# Patient Record
Sex: Male | Born: 1980 | Race: White | Hispanic: No | Marital: Married | Smoking: Never smoker
Health system: Southern US, Community
[De-identification: ages and names within clinical notes are randomized; demographics above are authoritative.]

---

## 2016-11-19 ENCOUNTER — Encounter (INDEPENDENT_AMBULATORY_CARE_PROVIDER_SITE_OTHER): Payer: BLUE CROSS/BLUE SHIELD | Admitting: Ophthalmology

## 2016-11-20 ENCOUNTER — Encounter (INDEPENDENT_AMBULATORY_CARE_PROVIDER_SITE_OTHER): Payer: BLUE CROSS/BLUE SHIELD | Admitting: Ophthalmology

## 2016-11-20 ENCOUNTER — Ambulatory Visit: Admit: 2016-11-20 | Payer: Self-pay | Admitting: Ophthalmology

## 2016-11-20 DIAGNOSIS — H338 Other retinal detachments: Secondary | ICD-10-CM

## 2016-11-20 DIAGNOSIS — H5213 Myopia, bilateral: Secondary | ICD-10-CM | POA: Diagnosis not present

## 2016-11-20 DIAGNOSIS — H2512 Age-related nuclear cataract, left eye: Secondary | ICD-10-CM

## 2016-11-20 DIAGNOSIS — H4312 Vitreous hemorrhage, left eye: Secondary | ICD-10-CM | POA: Diagnosis not present

## 2016-11-20 DIAGNOSIS — H43813 Vitreous degeneration, bilateral: Secondary | ICD-10-CM | POA: Diagnosis not present

## 2016-11-20 SURGERY — SCLERAL BUCKLE
Anesthesia: General

## 2018-10-18 ENCOUNTER — Ambulatory Visit: Payer: BC Managed Care – PPO

## 2018-10-18 ENCOUNTER — Other Ambulatory Visit: Payer: Self-pay

## 2018-10-18 DIAGNOSIS — Z23 Encounter for immunization: Secondary | ICD-10-CM

## 2019-02-18 ENCOUNTER — Emergency Department
Admission: EM | Admit: 2019-02-18 | Discharge: 2019-02-18 | Disposition: A | Discharge status: Home / Self Care | Payer: BC Managed Care – PPO | Attending: Emergency Medicine | Admitting: Emergency Medicine

## 2019-02-18 ENCOUNTER — Encounter: Payer: Self-pay | Admitting: Emergency Medicine

## 2019-02-18 ENCOUNTER — Other Ambulatory Visit: Payer: Self-pay

## 2019-02-18 ENCOUNTER — Emergency Department: Payer: BC Managed Care – PPO

## 2019-02-18 DIAGNOSIS — R0789 Other chest pain: Secondary | ICD-10-CM | POA: Diagnosis present

## 2019-02-18 DIAGNOSIS — R079 Chest pain, unspecified: Secondary | ICD-10-CM

## 2019-02-18 LAB — TROPONIN I (HIGH SENSITIVITY)
Troponin I (High Sensitivity): <2 ng/L (ref <18)
Troponin I (High Sensitivity): <2 ng/L (ref <18)

## 2019-02-18 LAB — BASIC METABOLIC PANEL
Anion gap: 11 (ref 5–15)
BUN: 17 mg/dL (ref 6–20)
CO2: 26 mmol/L (ref 22–32)
Calcium: 9.2 mg/dL (ref 8.9–10.3)
Chloride: 101 mmol/L (ref 98–111)
Creatinine, Ser: 0.86 mg/dL (ref 0.61–1.24)
GFR calc Af Amer: >60 mL/min (ref >60)
GFR calc non Af Amer: >60 mL/min (ref >60)
Glucose, Bld: 93 mg/dL (ref 70–99)
Potassium: 3.8 mmol/L (ref 3.5–5.1)
Sodium: 138 mmol/L (ref 135–145)

## 2019-02-18 LAB — CBC
HCT: 42.8 % (ref 39.0–52.0)
Hemoglobin: 14.8 g/dL (ref 13.0–17.0)
MCH: 30.5 pg (ref 26.0–34.0)
MCHC: 34.6 g/dL (ref 30.0–36.0)
MCV: 88.1 fL (ref 80.0–100.0)
Platelets: 266 10*3/uL (ref 150–400)
RBC: 4.86 MIL/uL (ref 4.22–5.81)
RDW: 11.8 % (ref 11.5–15.5)
WBC: 6.9 10*3/uL (ref 4.0–10.5)
nRBC: 0 % (ref 0.0–0.2)

## 2019-02-18 LAB — CK: Total CK: 141 U/L (ref 49–397)

## 2019-02-18 NOTE — ED Notes (Signed)
Pt reports fatigue and tightness on inspiration and states it radiates into his lower extremities

## 2019-02-18 NOTE — ED Triage Notes (Signed)
Pt was walking towards dinner table and reports got a severe chest pain to almost left mid axillary area.  Pain lasted severe for about 10 minutes.  Currently has a mild dull pain but all of his extremities feel "tight like I strenuously worked out".  Does c/o left arm feeling strained.  Legs also feel very tight and like the muscles are cramping.  Does not feel like he over did it when working out.

## 2019-02-18 NOTE — ED Provider Notes (Signed)
[facility] Emergency Department Provider Note   ____________________________________________   I have reviewed the triage vital signs and the nursing notes.   HISTORY  Chief Complaint Chest Pain   History limited by: Not Limited   HPI Broedy Osbourne is a 39 y.o. male who presents to the emergency department today because of concerns for chest pain.  Patient states that it occurred this afternoon.  He had acute onset of sharp left-sided pain.  After the sharp pain abated he felt still some discomfort in his chest and felt like his limbs were heavy.  The time my exam he states the chest pain is resolved but still feels like his legs are tired as if he had exerted them heavily.  Patient did not have any associated shortness of breath.  The patient denies any fevers.   Records reviewed.   History reviewed. No pertinent past medical history.  There are no problems to display for this patient.   History reviewed. No pertinent surgical history.  Prior to Admission medications   Not on File    Allergies Patient has no known allergies.  History reviewed. No pertinent family history.  Social History Social History   Tobacco Use  . Smoking status: Never Smoker  . Smokeless tobacco: Never Used  Substance Use Topics  . Alcohol use: Yes  . Drug use: Never    Review of Systems Constitutional: No fever/chills Eyes: No visual changes. ENT: No sore throat. Cardiovascular: Positive for left sided chest pain. Respiratory: Denies shortness of breath. Gastrointestinal: No abdominal pain.  No nausea, no vomiting.  No diarrhea.   Genitourinary: Negative for dysuria. Musculoskeletal: Positive for limb heaviness. Skin: Negative for rash. Neurological: Negative for headaches, focal weakness or numbness.  ____________________________________________   PHYSICAL EXAM:  VITAL SIGNS: ED Triage Vitals [02/18/19 1756]  Enc Vitals Group     BP (!) 143/90      Pulse Rate 80     Resp 16     Temp (!) 97.4 F (36.3 C)     Temp Source Oral     SpO2 99 %     Weight 155 lb (70.3 kg)     Height 5\' 10"  (1.778 m)     Head Circumference      Peak Flow      Pain Score 2   Constitutional: Alert and oriented.  Eyes: Conjunctivae are normal.  ENT      Head: Normocephalic and atraumatic.      Nose: No congestion/rhinnorhea.      Mouth/Throat: Mucous membranes are moist.      Neck: No stridor. Hematological/Lymphatic/Immunilogical: No cervical lymphadenopathy. Cardiovascular: Normal rate, regular rhythm.  No murmurs, rubs, or gallops.  Respiratory: Normal respiratory effort without tachypnea nor retractions. Breath sounds are clear and equal bilaterally. No wheezes/rales/rhonchi. Gastrointestinal: Soft and non tender. No rebound. No guarding.  Genitourinary: Deferred Musculoskeletal: Normal range of motion in all extremities. No lower extremity edema. Neurologic:  Normal speech and language. No gross focal neurologic deficits are appreciated.  Skin:  Skin is warm, dry and intact. No rash noted. Psychiatric: Mood and affect are normal. Speech and behavior are normal. Patient exhibits appropriate insight and judgment.  ____________________________________________    LABS (pertinent positives/negatives)  Trop hs <2 x 2 CK 141 CBC wbc 6.9, hgb 14.8, plt 266 BMP wnl  ____________________________________________   EKG  I, Nance Pear, attending physician, personally viewed and interpreted this EKG  EKG Time: 1755 Rate: 78 Rhythm: normal sinus rhythm  Axis: normal Intervals: qtc 442 QRS: narrow ST changes: no st elevation Impression: normal ekg   ____________________________________________    RADIOLOGY  CXR No acute abnormality  ____________________________________________   PROCEDURES  Procedures  ____________________________________________   INITIAL IMPRESSION / ASSESSMENT AND PLAN / ED COURSE  Pertinent  labs & imaging results that were available during my care of the patient were reviewed by me and considered in my medical decision making (see chart for details).   Patient presented to the emergency department today because of concerns for chest pain.  By the time my exam he was feeling better although he stated he felt like his lungs were somewhat tired and fatigued.  Differential would be broad including ACS, arrhythmia, PE, dissection, pneumonia, pneumothorax, costochondritis, esophagitis or esophageal spasm amongst other etiologies.  Patient's troponins were negative x2.  Chest x-ray without evidence of pneumonia, pneumothorax or widened mediastinum.  This point I have extremely low suspicion for PE or aortic dissection given clinical history and exam.  I doubt ACS.  I discussed possibility of an arrhythmia however EKG at this time without concerning abnormalities.  Given the patient is feeling better I think it is reasonable for patient be discharged home.  Discussed with patient importance of following up with primary care.  ____________________________________________   FINAL CLINICAL IMPRESSION(S) / ED DIAGNOSES  Final diagnoses:  Nonspecific chest pain     Note: This dictation was prepared with Dragon dictation. Any transcriptional errors that result from this process are unintentional Phineas Semen, MD 02/18/19 2107

## 2019-02-18 NOTE — Discharge Instructions (Addendum)
Please seek medical attention for any high fevers, further chest pain, shortness of breath, change in behavior, persistent vomiting, bloody stool or any other new or concerning symptoms.

## 2019-04-26 ENCOUNTER — Other Ambulatory Visit: Payer: Self-pay | Admitting: Sports Medicine

## 2019-04-26 DIAGNOSIS — M5137 Other intervertebral disc degeneration, lumbosacral region: Secondary | ICD-10-CM

## 2019-04-26 DIAGNOSIS — M5442 Lumbago with sciatica, left side: Secondary | ICD-10-CM

## 2019-05-12 ENCOUNTER — Other Ambulatory Visit: Payer: Self-pay

## 2019-05-12 ENCOUNTER — Ambulatory Visit
Admission: RE | Admit: 2019-05-12 | Discharge: 2019-05-12 | Disposition: A | Discharge status: Home / Self Care | Payer: BC Managed Care – PPO | Source: Ambulatory Visit | Attending: Sports Medicine | Admitting: Sports Medicine

## 2019-05-12 DIAGNOSIS — M5137 Other intervertebral disc degeneration, lumbosacral region: Secondary | ICD-10-CM | POA: Diagnosis present

## 2019-05-12 DIAGNOSIS — M5442 Lumbago with sciatica, left side: Secondary | ICD-10-CM | POA: Diagnosis present

## 2020-08-19 IMAGING — CR DG CHEST 2V
1 series · 2 of 2 positions shown · non-contrast
Comparison: None.

CLINICAL DATA: Chest pain

EXAM:
CHEST - 2 VIEW

[Series 1: w chest pa · 0.14mm/px · 2 of 2 slices shown]
[im 1/2]
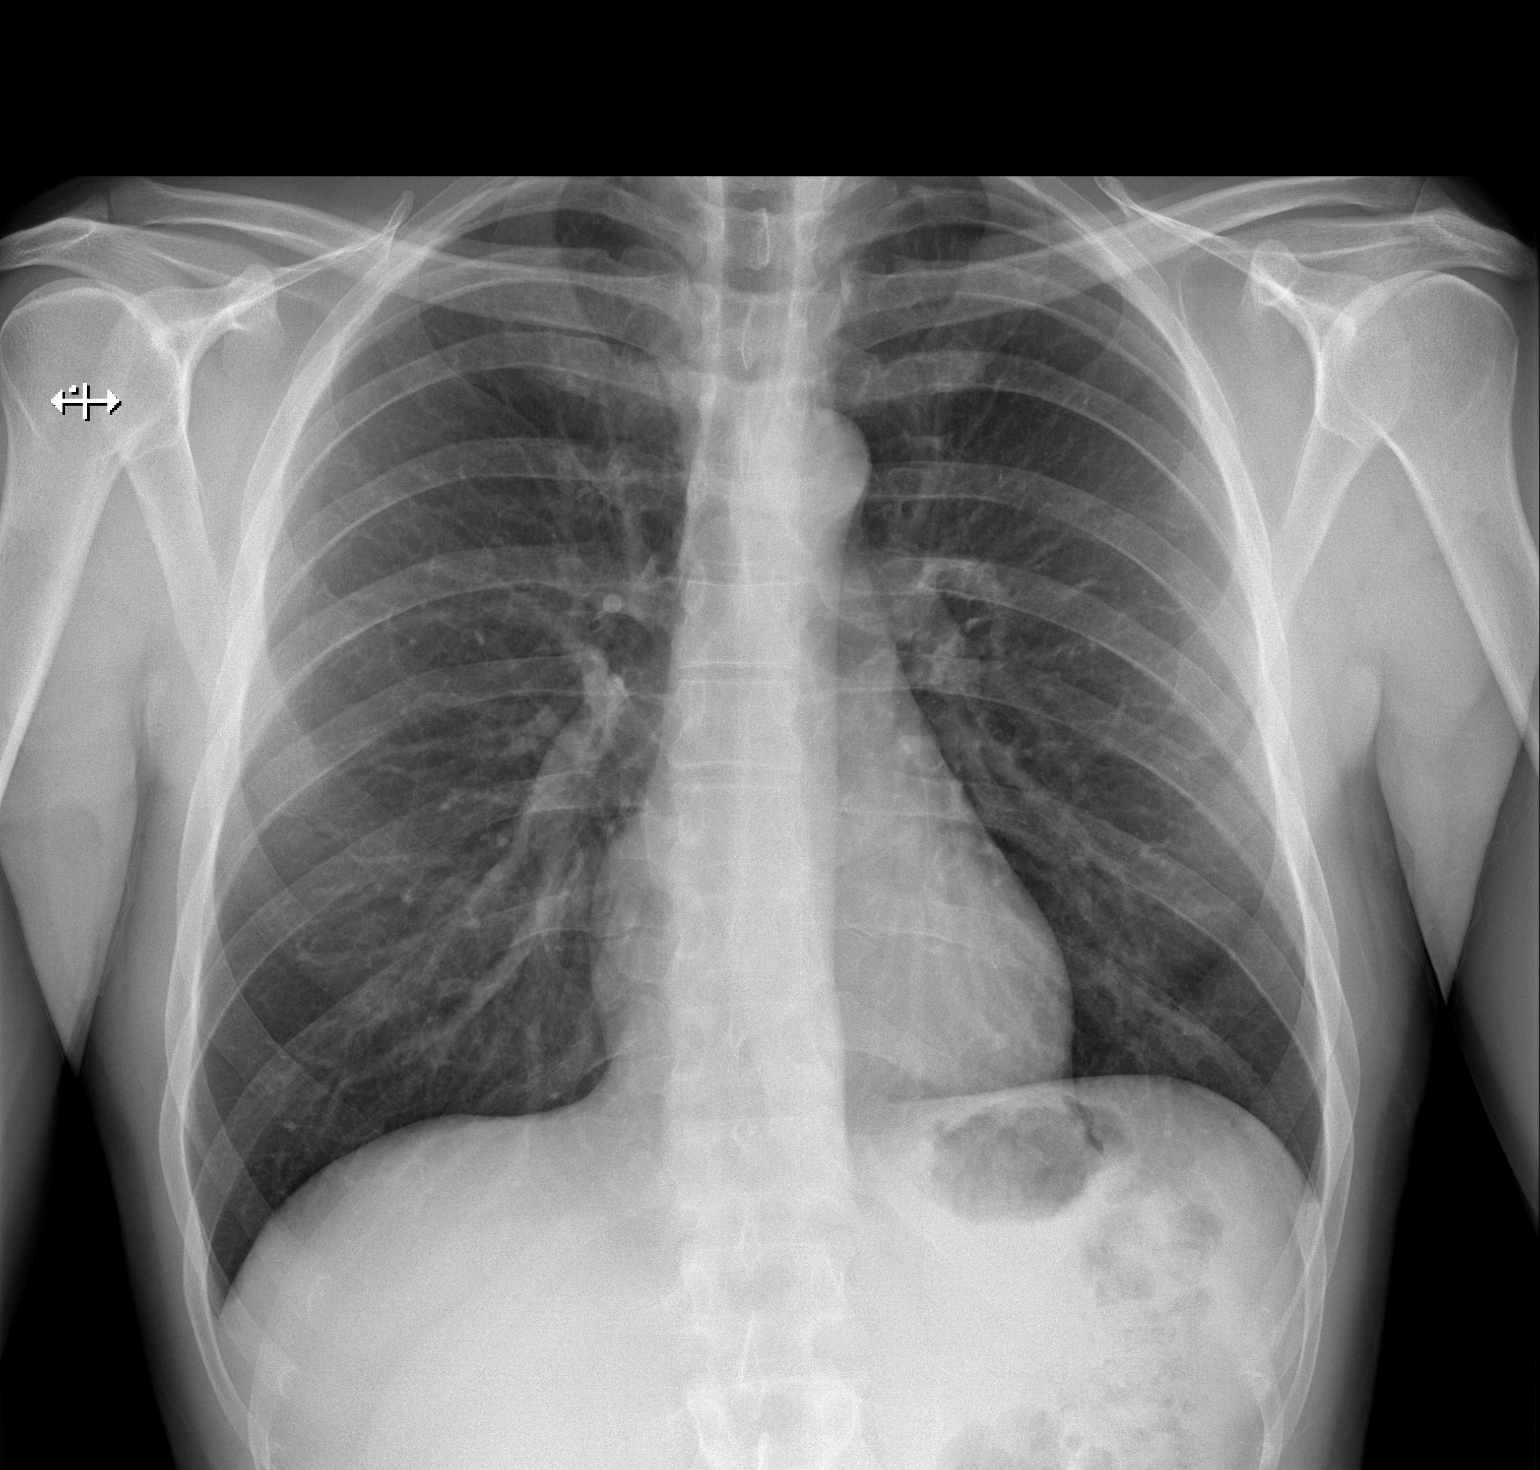
[im 2/2]
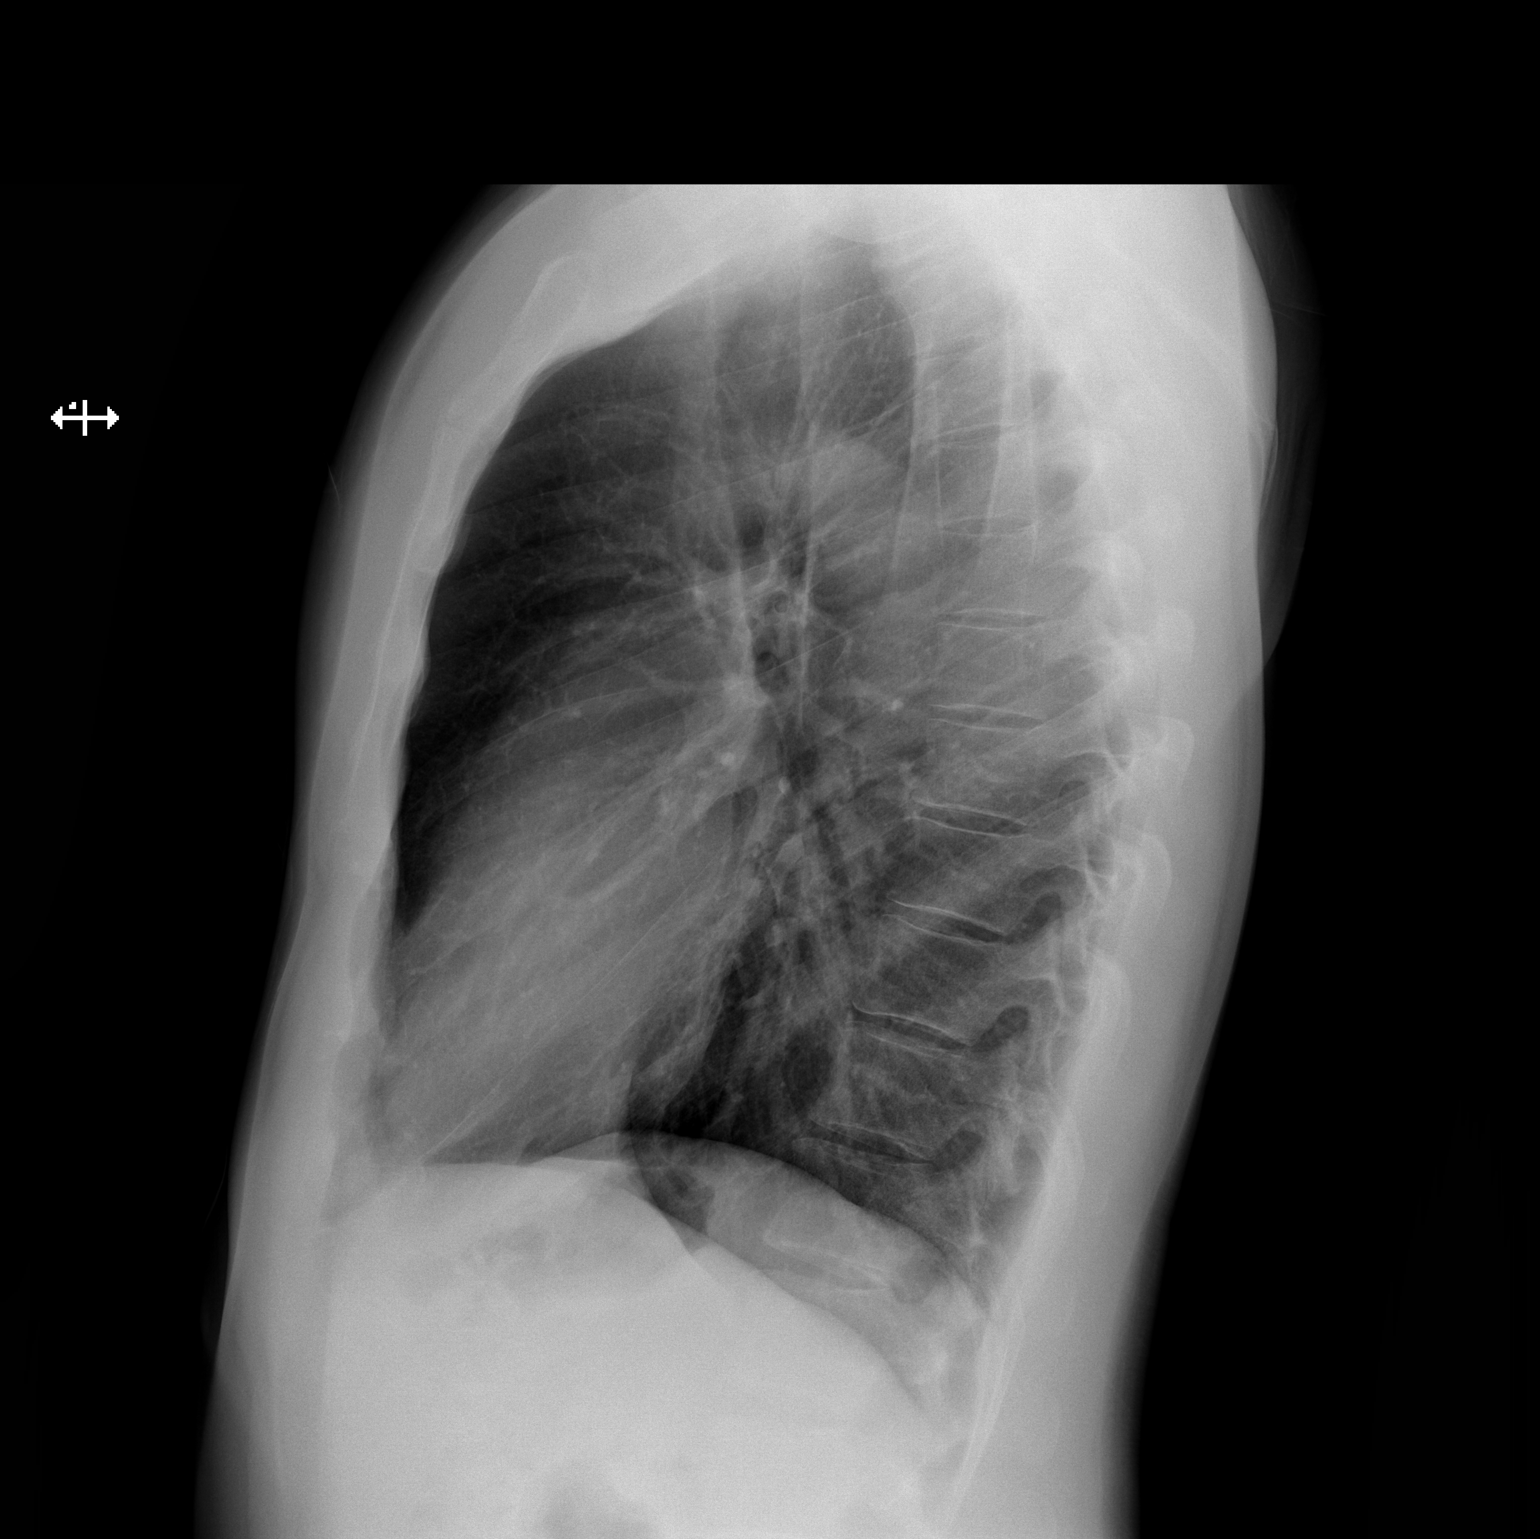

[2 of 2 positions shown; findings below may reference images not displayed]

FINDINGS: The heart size and mediastinal contours are within normal limits.
Both lungs are clear. The visualized skeletal structures are
unremarkable.
IMPRESSION: No active cardiopulmonary disease.

## 2020-10-30 ENCOUNTER — Ambulatory Visit: Payer: BC Managed Care – PPO

## 2020-10-30 ENCOUNTER — Other Ambulatory Visit: Payer: Self-pay

## 2020-10-30 DIAGNOSIS — Z23 Encounter for immunization: Secondary | ICD-10-CM
